# Patient Record
Sex: Male | Born: 1985 | Race: White | Hispanic: No | Marital: Married | Smoking: Never smoker
Health system: Southern US, Community
[De-identification: ages and names within clinical notes are randomized; demographics above are authoritative.]

## PROBLEM LIST (undated history)

## (undated) DIAGNOSIS — F419 Anxiety disorder, unspecified: Secondary | ICD-10-CM

## (undated) DIAGNOSIS — I10 Essential (primary) hypertension: Secondary | ICD-10-CM

---
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
## 2008-12-15 ENCOUNTER — Emergency Department (HOSPITAL_COMMUNITY): Admission: EM | Admit: 2008-12-15 | Discharge: 2008-12-15 | Payer: Self-pay | Admitting: Emergency Medicine

## 2010-04-03 IMAGING — US US ABDOMEN COMPLETE
1 series · 14 of 25 positions shown · non-contrast
Comparison: None

CLINICAL DATA: Right upper quadrant pain for 1 day.

COMPLETE ABDOMINAL ULTRASOUND

[Series 1: us abdomen complete · 0.31mm/px · 14 of 72 slices shown]
[im 1/72]
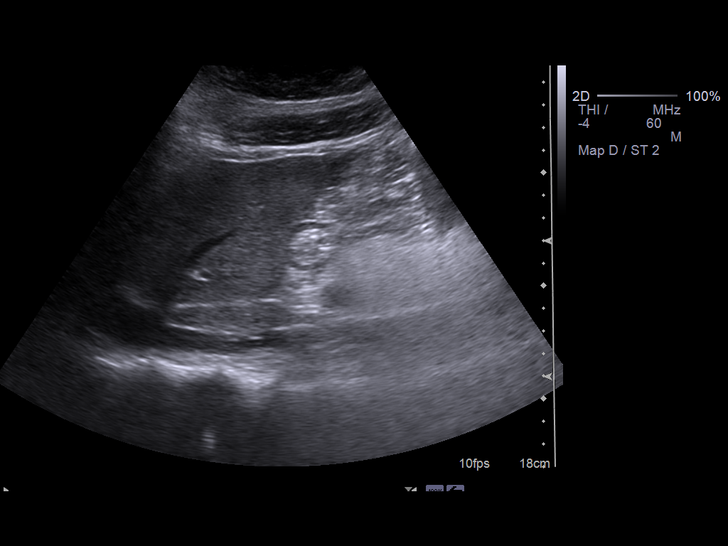
[im 6/72]
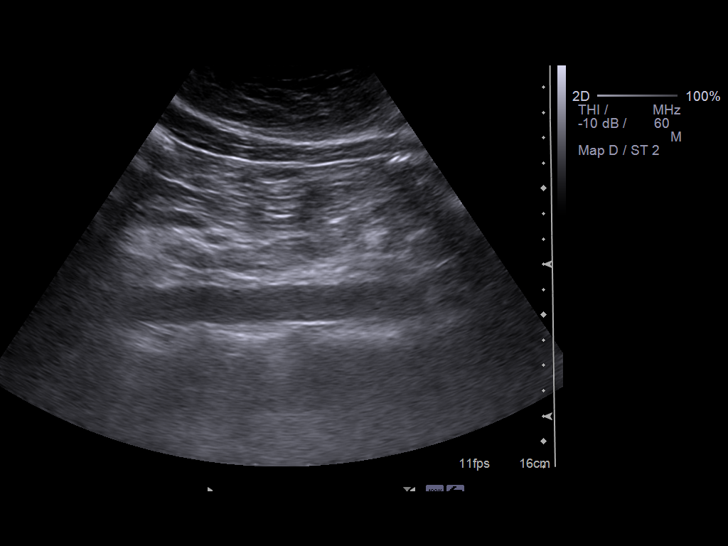
[im 12/72]
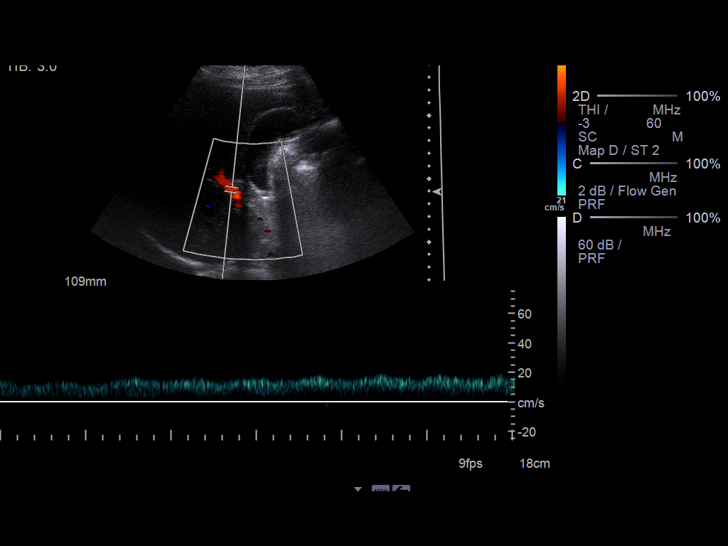
[im 18/72]
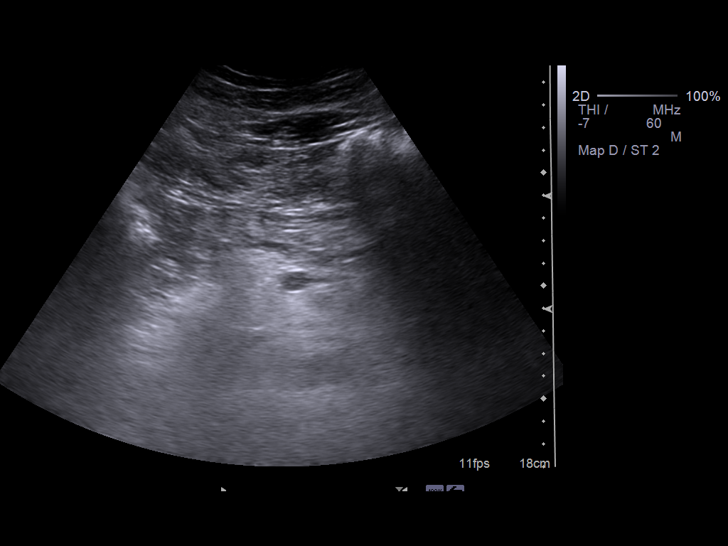
[im 24/72]
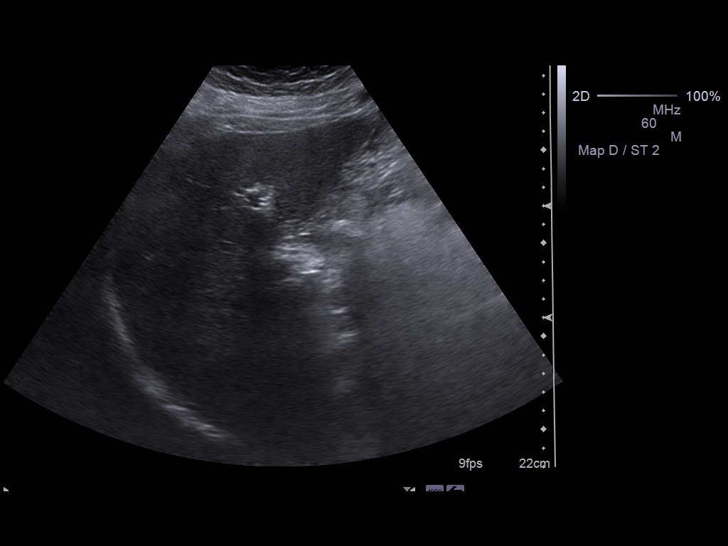
[im 27/72]
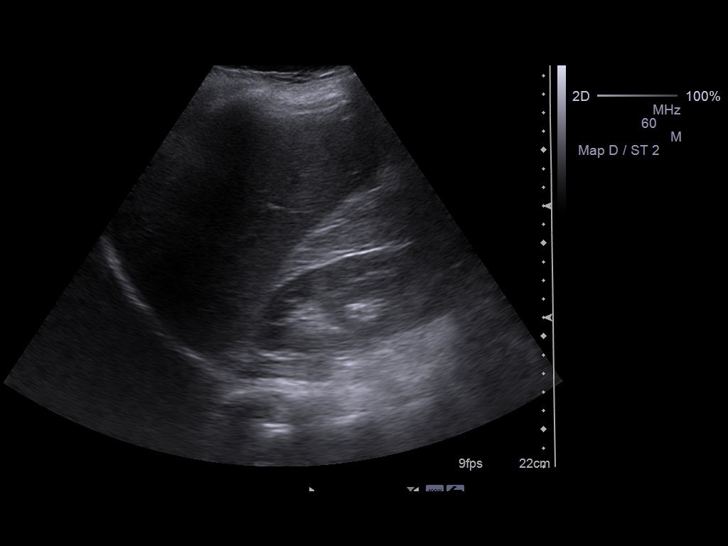
[im 33/72]
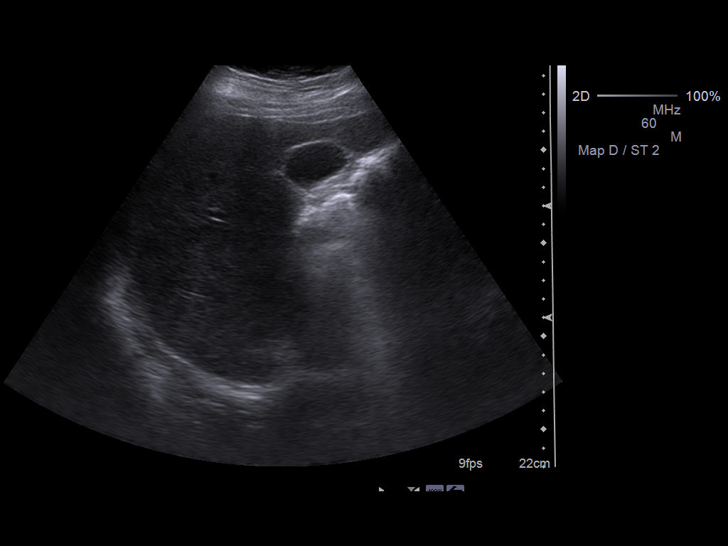
[im 39/72]
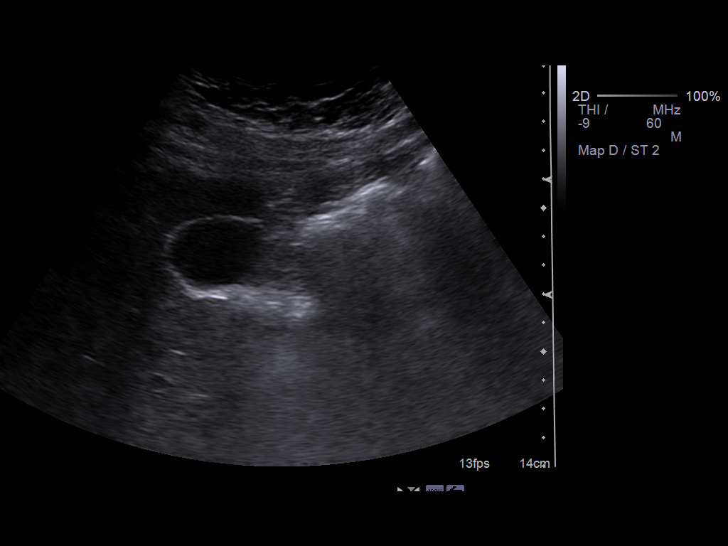
[im 45/72]
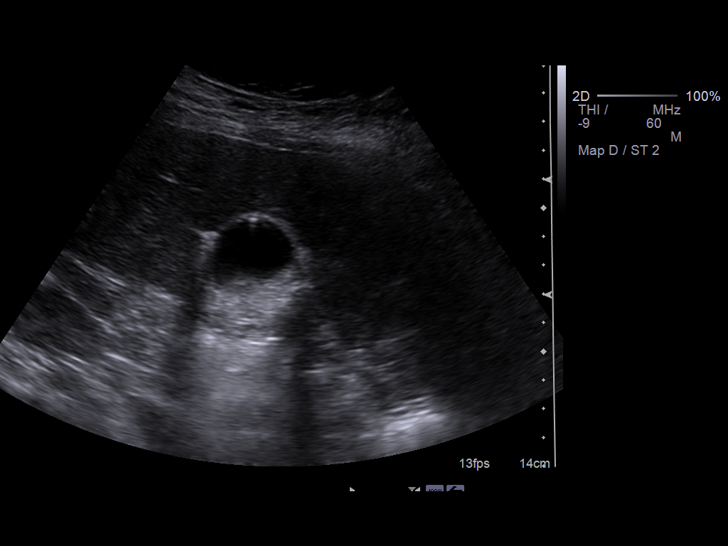
[im 48/72]
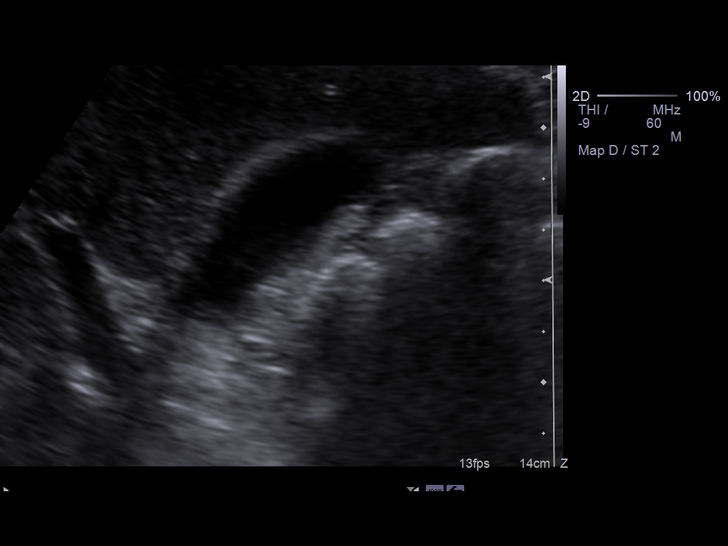
[im 54/72]
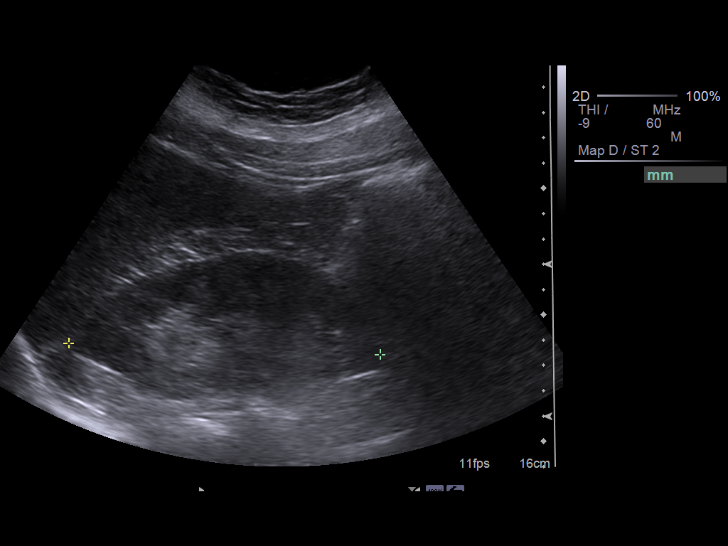
[im 60/72]
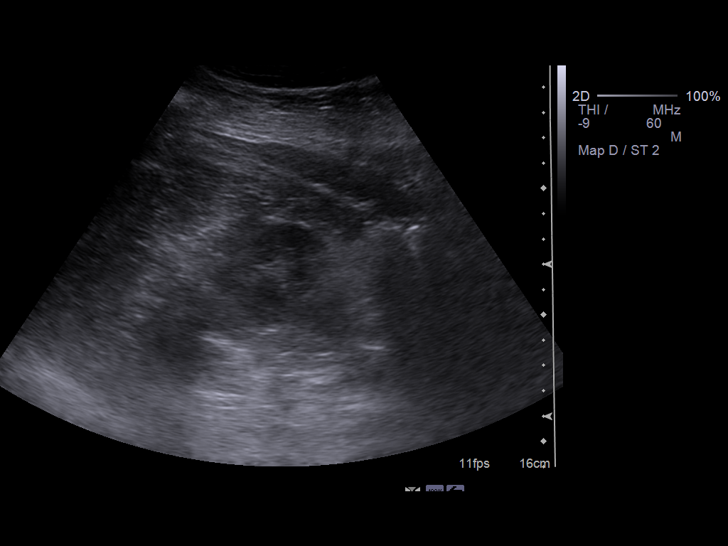
[im 66/72]
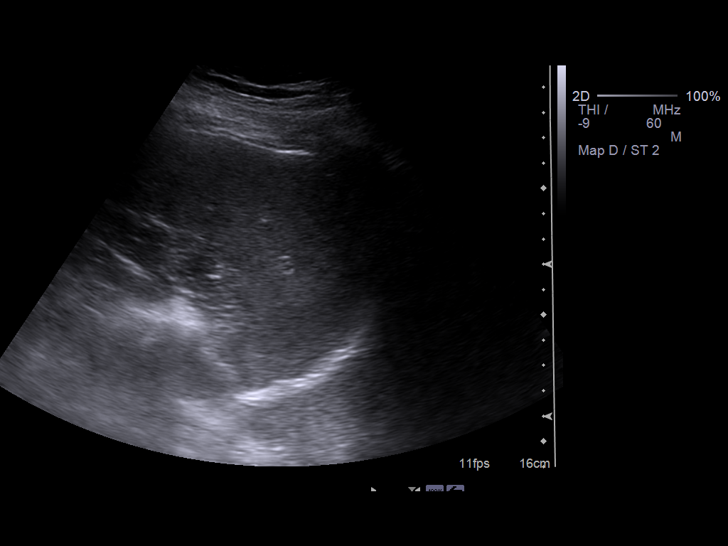
[im 72/72]
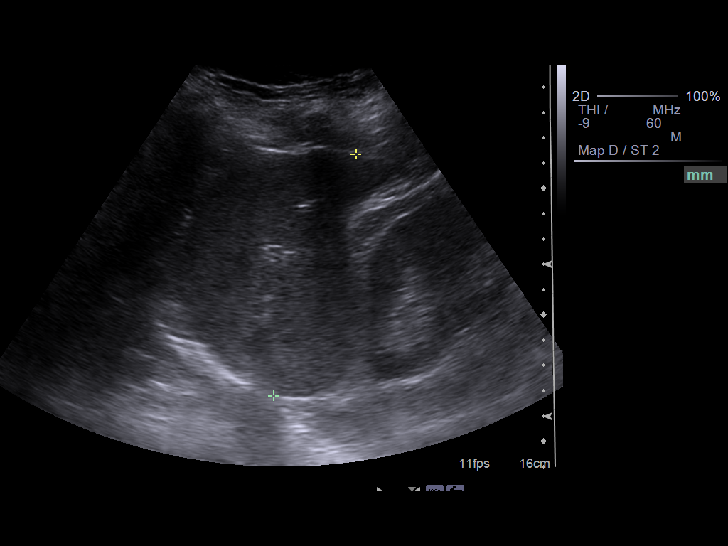

[14 of 25 positions shown; findings below may reference images not displayed]

FINDINGS: Gallbladder:  A 3 mm echogenic non dependent focus within the
gallbladder.  Probable comet-tail artifact on image 49.  No wall
thickening, calcified/shadowing stones, or pericholecystic fluid.
Sonographic Murphy's sign was not elicited.

Common bile duct: Normal, 3 mm.

Liver: Normal in echogenicity, without focal lesion.

IVC: Negative

Pancreas:  Poorly visualized due to overlying bowel gas.  Cannot
exclude pancreatic enlargement including on image 7.

Spleen:  Normal in size and echogenicity.

Right Kidney:  12.3 cm. No hydronephrosis.

Left Kidney:  11.7 cm. No hydronephrosis.

Abdominal aorta:  Poorly visualized due to overlying bowel gas.
IMPRESSION: 1.  No gallstones or acute cholecystitis.
2.  Echogenic focus along the nondependent gallbladder wall is
likely tiny polyp.  Concurrent comet-tail artifact is suspicious
for adenomyomatosis.
3.  No biliary ductal dilatation.
4.  Suboptimal visualization of the pancreas.  Cannot exclude
pancreatic enlargement.  If pancreatitis is a concern, consider
correlation with pancreatic enzymes.

## 2010-05-19 LAB — COMPREHENSIVE METABOLIC PANEL
ALT: 20 U/L (ref 0–53)
AST: 20 U/L (ref 0–37)
Albumin: 4 g/dL (ref 3.5–5.2)
Alkaline Phosphatase: 56 U/L (ref 39–117)
BUN: 9 mg/dL (ref 6–23)
CO2: 27 mEq/L (ref 19–32)
Calcium: 9.1 mg/dL (ref 8.4–10.5)
Chloride: 105 mEq/L (ref 96–112)
Creatinine, Ser: 0.86 mg/dL (ref 0.4–1.5)
GFR calc Af Amer: >60 mL/min (ref >60)
GFR calc non Af Amer: >60 mL/min (ref >60)
Glucose, Bld: 109 mg/dL — ABNORMAL HIGH (ref 70–99)
Potassium: 3.2 mEq/L — ABNORMAL LOW (ref 3.5–5.1)
Sodium: 138 mEq/L (ref 135–145)
Total Bilirubin: 0.5 mg/dL (ref 0.3–1.2)
Total Protein: 7.5 g/dL (ref 6.0–8.3)

## 2010-05-19 LAB — DIFFERENTIAL
Basophils Absolute: 0.1 10*3/uL (ref 0.0–0.1)
Basophils Relative: 1 % (ref 0–1)
Eosinophils Absolute: 0.4 10*3/uL (ref 0.0–0.7)
Eosinophils Relative: 3 % (ref 0–5)
Lymphocytes Relative: 38 % (ref 12–46)
Lymphs Abs: 5 10*3/uL — ABNORMAL HIGH (ref 0.7–4.0)
Monocytes Absolute: 0.7 10*3/uL (ref 0.1–1.0)
Monocytes Relative: 6 % (ref 3–12)
Neutro Abs: 7.2 10*3/uL (ref 1.7–7.7)
Neutrophils Relative %: 54 % (ref 43–77)

## 2010-05-19 LAB — URINALYSIS, ROUTINE W REFLEX MICROSCOPIC
Bilirubin Urine: NEGATIVE
Glucose, UA: NEGATIVE mg/dL
Hgb urine dipstick: NEGATIVE
Ketones, ur: NEGATIVE mg/dL
Nitrite: NEGATIVE
Protein, ur: NEGATIVE mg/dL
Specific Gravity, Urine: 1.026 (ref 1.005–1.030)
Urobilinogen, UA: 1 mg/dL (ref 0.0–1.0)
pH: 5.5 (ref 5.0–8.0)

## 2010-05-19 LAB — CBC
HCT: 47.4 % (ref 39.0–52.0)
Hemoglobin: 16.1 g/dL (ref 13.0–17.0)
MCHC: 33.9 g/dL (ref 30.0–36.0)
MCV: 88.2 fL (ref 78.0–100.0)
Platelets: 323 10*3/uL (ref 150–400)
RBC: 5.38 MIL/uL (ref 4.22–5.81)
RDW: 12.9 % (ref 11.5–15.5)
WBC: 13.4 10*3/uL — ABNORMAL HIGH (ref 4.0–10.5)

## 2010-05-19 LAB — LIPASE, BLOOD: Lipase: 23 U/L (ref 11–59)

## 2021-12-14 ENCOUNTER — Other Ambulatory Visit (HOSPITAL_COMMUNITY): Payer: Self-pay

## 2021-12-14 MED ORDER — ESCITALOPRAM OXALATE 10 MG PO TABS
10.0000 mg | ORAL_TABLET | Freq: Every day | ORAL | 0 refills | Status: DC
  Filled 2021-12-14: qty 30, 30d supply, fill #0

## 2022-01-10 ENCOUNTER — Other Ambulatory Visit (HOSPITAL_COMMUNITY): Payer: Self-pay

## 2022-01-11 ENCOUNTER — Other Ambulatory Visit (HOSPITAL_COMMUNITY): Payer: Self-pay

## 2022-01-11 MED ORDER — ESCITALOPRAM OXALATE 10 MG PO TABS
10.0000 mg | ORAL_TABLET | Freq: Every day | ORAL | 0 refills | Status: DC
  Filled 2022-01-11: qty 30, 30d supply, fill #0

## 2022-02-20 ENCOUNTER — Other Ambulatory Visit (HOSPITAL_COMMUNITY): Payer: Self-pay

## 2022-02-21 ENCOUNTER — Other Ambulatory Visit (HOSPITAL_COMMUNITY): Payer: Self-pay

## 2022-02-21 MED ORDER — ESCITALOPRAM OXALATE 10 MG PO TABS
10.0000 mg | ORAL_TABLET | Freq: Every day | ORAL | 5 refills | Status: AC
  Filled 2022-02-21: qty 30, 30d supply, fill #0
  Filled 2022-05-03: qty 30, 30d supply, fill #1
  Filled 2022-08-08: qty 30, 30d supply, fill #2
  Filled 2022-09-06: qty 30, 30d supply, fill #3
  Filled 2022-10-11: qty 30, 30d supply, fill #4
  Filled 2022-11-13: qty 30, 30d supply, fill #5

## 2022-03-07 ENCOUNTER — Other Ambulatory Visit (HOSPITAL_COMMUNITY): Payer: Self-pay

## 2022-03-07 MED ORDER — VALSARTAN 80 MG PO TABS
80.0000 mg | ORAL_TABLET | Freq: Every day | ORAL | 0 refills | Status: AC
  Filled 2022-03-07: qty 90, 90d supply, fill #0

## 2022-03-28 ENCOUNTER — Other Ambulatory Visit (HOSPITAL_COMMUNITY): Payer: Self-pay

## 2022-03-28 MED ORDER — ESCITALOPRAM OXALATE 10 MG PO TABS
10.0000 mg | ORAL_TABLET | Freq: Every day | ORAL | 5 refills | Status: AC
  Filled 2022-03-28: qty 30, 30d supply, fill #0
  Filled 2022-07-12: qty 30, 30d supply, fill #1
  Filled 2022-12-16: qty 30, 30d supply, fill #2
  Filled 2023-01-19: qty 30, 30d supply, fill #3
  Filled 2023-02-17: qty 30, 30d supply, fill #4
  Filled 2023-03-17: qty 30, 30d supply, fill #5

## 2022-05-03 ENCOUNTER — Other Ambulatory Visit: Payer: Self-pay

## 2022-05-31 ENCOUNTER — Ambulatory Visit
Admission: RE | Admit: 2022-05-31 | Discharge: 2022-05-31 | Disposition: A | Discharge status: Home / Self Care | Payer: Commercial Managed Care - PPO | Source: Ambulatory Visit | Attending: Family Medicine | Admitting: Family Medicine

## 2022-05-31 ENCOUNTER — Ambulatory Visit (INDEPENDENT_AMBULATORY_CARE_PROVIDER_SITE_OTHER): Payer: Commercial Managed Care - PPO

## 2022-05-31 VITALS — BP 143/85 | HR 103 | Temp 98.5°F | Resp 18

## 2022-05-31 DIAGNOSIS — M25571 Pain in right ankle and joints of right foot: Secondary | ICD-10-CM

## 2022-05-31 DIAGNOSIS — M7989 Other specified soft tissue disorders: Secondary | ICD-10-CM | POA: Diagnosis not present

## 2022-05-31 HISTORY — DX: Essential (primary) hypertension: I10

## 2022-05-31 HISTORY — DX: Anxiety disorder, unspecified: F41.9

## 2022-05-31 NOTE — ED Triage Notes (Signed)
Pt reports on 4/7 he stepped into a hole and rolled his right ankle. Pt reports he has not been seen for an ankle eval. Took ibuprofen and ice . Hurts worst with waling.

## 2022-05-31 NOTE — Discharge Instructions (Signed)
Continue taking ibuprofen regularly over the next several days. Activities as tolerated.

## 2022-06-01 NOTE — ED Provider Notes (Signed)
[facility]   [id number] 05/31/22 Arrival Time: 1834  ASSESSMENT & PLAN:  1. Acute right ankle pain    I have personally viewed and independently interpreted the imaging studies ordered this visit. No acute changes on R ankle films. No fracture appreciated.  OTC ibuprofen.  Orders Placed This Encounter  Procedures   DG Ankle Complete Right   Apply ASO lace-up ankle brace   Declines work note.  Recommend:  Follow-up Information     Schedule an appointment as soon as possible for a visit  with [facility]).   Why: If worsening or failing to improve as anticipated. Contact information: [address] [place],  [place]  [address]  [phone]                Reviewed expectations re: course of current medical issues. Questions answered. Outlined signs and symptoms indicating need for more acute intervention. Patient verbalized understanding. After Visit Summary given.  SUBJECTIVE: History from: patient. Kevin Floyd is a 37 y.o. male who reports stepping into hole on  4/7; rolled his right ankle. Pt reports he has not been seen for an ankle eval. Took ibuprofen and ice . Hurts worst with prolonged standing/walking. No extremity sensation changes or weakness.   Past Surgical History:  Procedure Laterality Date   HAND SURGERY  2005      OBJECTIVE:  Vitals:   05/31/22 1905  BP: (!) 143/85  Pulse: (!) 103  Resp: 18  Temp: 98.5 F (36.9 C)  TempSrc: Oral  SpO2: 93%    General appearance: alert; no distress HEENT: [place]; AT Neck: supple with FROM Resp: unlabored respirations Extremities: RLE: warm with well perfused appearance; fairly well localized moderate tenderness over right medial malleolus; without gross deformities; swelling: mild; bruising: none; ankle ROM: normal CV: brisk extremity capillary refill of RLE; 2+ DP pulse of RLE. Skin: warm and dry; no visible rashes Neurologic: gait normal; normal  sensation and strength of RLE Psychological: alert and cooperative; normal mood and affect  Imaging: DG Ankle Complete Right  Result Date: 05/31/2022 CLINICAL DATA:  Rolled ankle. EXAM: RIGHT ANKLE - COMPLETE 3 VIEW COMPARISON:  None Available. FINDINGS: Prominent soft tissue swelling around the ankle. No acute fracture or dislocation. Corticated spur below the medial malleolus. IMPRESSION: Soft tissue swelling without acute osseous finding. Electronically Signed   By: Tiburcio Pea M.D.   On: 05/31/2022 19:42      No Active Allergies  Past Medical History:  Diagnosis Date   Anxiety    Hypertension    Social History   Socioeconomic History   Marital status: Married    Spouse name: Not on file   Number of children: Not on file   Years of education: Not on file   Highest education level: Not on file  Occupational History   Not on file  Tobacco Use   Smoking status: Never   Smokeless tobacco: Never  Vaping Use   Vaping Use: Never used  Substance and Sexual Activity   Alcohol use: Yes   Drug use: Never   Sexual activity: Yes  Other Topics Concern   Not on file  Social History Narrative   Not on file   Social Determinants of Health   Financial Resource Strain: Not on file  Food Insecurity: Not on file  Transportation Needs: Not on file  Physical Activity: Not on file  Stress: Not on file  Social Connections: Not on file   History reviewed.  No pertinent family history. Past Surgical History:  Procedure Laterality Date   HAND SURGERY  2005       Mardella Layman, MD 06/01/22 [phone]

## 2022-06-07 ENCOUNTER — Other Ambulatory Visit (HOSPITAL_COMMUNITY): Payer: Self-pay

## 2022-06-07 MED ORDER — ESCITALOPRAM OXALATE 10 MG PO TABS
10.0000 mg | ORAL_TABLET | Freq: Every day | ORAL | 5 refills | Status: DC
  Filled 2022-06-07: qty 30, 30d supply, fill #0
  Filled 2023-04-18: qty 30, 30d supply, fill #1
  Filled 2023-05-23: qty 30, 30d supply, fill #2

## 2022-06-07 MED ORDER — VALSARTAN 80 MG PO TABS
80.0000 mg | ORAL_TABLET | Freq: Every day | ORAL | 0 refills | Status: DC
  Filled 2022-06-07: qty 90, 90d supply, fill #0

## 2022-08-11 ENCOUNTER — Other Ambulatory Visit (HOSPITAL_COMMUNITY): Payer: Self-pay

## 2022-09-06 ENCOUNTER — Other Ambulatory Visit: Payer: Self-pay

## 2022-09-06 ENCOUNTER — Other Ambulatory Visit (HOSPITAL_COMMUNITY): Payer: Self-pay

## 2022-09-06 MED ORDER — VALSARTAN 80 MG PO TABS
80.0000 mg | ORAL_TABLET | Freq: Every day | ORAL | 0 refills | Status: DC
  Filled 2022-09-06: qty 90, 90d supply, fill #0

## 2022-12-09 ENCOUNTER — Encounter (HOSPITAL_COMMUNITY): Payer: Self-pay

## 2022-12-09 ENCOUNTER — Other Ambulatory Visit (HOSPITAL_COMMUNITY): Payer: Self-pay

## 2022-12-09 MED ORDER — VALSARTAN 80 MG PO TABS
80.0000 mg | ORAL_TABLET | Freq: Every day | ORAL | 0 refills | Status: DC
  Filled 2022-12-09: qty 90, 90d supply, fill #0

## 2023-03-07 ENCOUNTER — Other Ambulatory Visit: Payer: Self-pay

## 2023-03-07 ENCOUNTER — Other Ambulatory Visit (HOSPITAL_COMMUNITY): Payer: Self-pay

## 2023-03-07 MED ORDER — VALSARTAN 80 MG PO TABS
80.0000 mg | ORAL_TABLET | Freq: Every day | ORAL | 0 refills | Status: DC
  Filled 2023-03-07: qty 90, 90d supply, fill #0

## 2023-05-24 DIAGNOSIS — E669 Obesity, unspecified: Secondary | ICD-10-CM | POA: Diagnosis not present

## 2023-05-24 DIAGNOSIS — Z6841 Body Mass Index (BMI) 40.0 and over, adult: Secondary | ICD-10-CM | POA: Diagnosis not present

## 2023-05-24 DIAGNOSIS — H6123 Impacted cerumen, bilateral: Secondary | ICD-10-CM | POA: Diagnosis not present

## 2023-05-24 DIAGNOSIS — E785 Hyperlipidemia, unspecified: Secondary | ICD-10-CM | POA: Diagnosis not present

## 2023-05-24 DIAGNOSIS — Z79899 Other long term (current) drug therapy: Secondary | ICD-10-CM | POA: Diagnosis not present

## 2023-05-24 DIAGNOSIS — Z0000 Encounter for general adult medical examination without abnormal findings: Secondary | ICD-10-CM | POA: Diagnosis not present

## 2023-05-24 DIAGNOSIS — I10 Essential (primary) hypertension: Secondary | ICD-10-CM | POA: Diagnosis not present

## 2023-05-24 DIAGNOSIS — R4586 Emotional lability: Secondary | ICD-10-CM | POA: Diagnosis not present

## 2023-06-12 ENCOUNTER — Other Ambulatory Visit (HOSPITAL_COMMUNITY): Payer: Self-pay

## 2023-06-12 MED ORDER — ROSUVASTATIN CALCIUM 10 MG PO TABS
10.0000 mg | ORAL_TABLET | Freq: Every day | ORAL | 1 refills | Status: DC
  Filled 2023-06-12: qty 90, 90d supply, fill #0
  Filled 2023-09-12: qty 90, 90d supply, fill #1

## 2023-06-15 ENCOUNTER — Other Ambulatory Visit (HOSPITAL_COMMUNITY): Payer: Self-pay

## 2023-06-15 MED ORDER — VALSARTAN 80 MG PO TABS
80.0000 mg | ORAL_TABLET | Freq: Every day | ORAL | 3 refills | Status: AC
  Filled 2023-06-15: qty 90, 90d supply, fill #0
  Filled 2023-09-12: qty 90, 90d supply, fill #1
  Filled 2023-12-28: qty 90, 90d supply, fill #2

## 2023-06-18 ENCOUNTER — Other Ambulatory Visit (HOSPITAL_COMMUNITY): Payer: Self-pay

## 2023-06-19 ENCOUNTER — Other Ambulatory Visit (HOSPITAL_COMMUNITY): Payer: Self-pay

## 2023-06-19 MED ORDER — ESCITALOPRAM OXALATE 10 MG PO TABS
10.0000 mg | ORAL_TABLET | Freq: Every day | ORAL | 5 refills | Status: DC
  Filled 2023-06-19: qty 30, 30d supply, fill #0
  Filled 2023-07-20: qty 30, 30d supply, fill #1
  Filled 2023-08-23: qty 30, 30d supply, fill #2
  Filled 2023-09-18: qty 30, 30d supply, fill #3
  Filled 2023-10-31: qty 30, 30d supply, fill #4
  Filled 2023-12-01: qty 30, 30d supply, fill #5

## 2023-12-28 ENCOUNTER — Other Ambulatory Visit (HOSPITAL_COMMUNITY): Payer: Self-pay

## 2023-12-28 ENCOUNTER — Other Ambulatory Visit: Payer: Self-pay

## 2023-12-28 MED ORDER — ROSUVASTATIN CALCIUM 10 MG PO TABS
10.0000 mg | ORAL_TABLET | Freq: Every day | ORAL | 1 refills | Status: AC
  Filled 2023-12-28: qty 90, 90d supply, fill #0

## 2023-12-28 MED ORDER — ESCITALOPRAM OXALATE 10 MG PO TABS
10.0000 mg | ORAL_TABLET | Freq: Every day | ORAL | 5 refills | Status: AC
  Filled 2023-12-28: qty 30, 30d supply, fill #0
  Filled 2024-01-31: qty 30, 30d supply, fill #1
  Filled 2024-03-06: qty 30, 30d supply, fill #2
